# Patient Record
Sex: Female | Born: 2000 | Race: White | Hispanic: No | Marital: Single | Smoking: Never smoker
Health system: Southern US, Community
[De-identification: ages and names within clinical notes are randomized; demographics above are authoritative.]

## PROBLEM LIST (undated history)

## (undated) DIAGNOSIS — E236 Other disorders of pituitary gland: Secondary | ICD-10-CM

## (undated) DIAGNOSIS — K8020 Calculus of gallbladder without cholecystitis without obstruction: Secondary | ICD-10-CM

## (undated) DIAGNOSIS — R519 Headache, unspecified: Secondary | ICD-10-CM

## (undated) DIAGNOSIS — G935 Compression of brain: Secondary | ICD-10-CM

## (undated) DIAGNOSIS — Z8489 Family history of other specified conditions: Secondary | ICD-10-CM

## (undated) DIAGNOSIS — T7840XA Allergy, unspecified, initial encounter: Secondary | ICD-10-CM

## (undated) DIAGNOSIS — Z973 Presence of spectacles and contact lenses: Secondary | ICD-10-CM

## (undated) DIAGNOSIS — R51 Headache: Secondary | ICD-10-CM

## (undated) DIAGNOSIS — G43909 Migraine, unspecified, not intractable, without status migrainosus: Secondary | ICD-10-CM

## (undated) HISTORY — PX: ADENOIDECTOMY: SUR15

## (undated) HISTORY — PX: ADENOIDECTOMY W/ MYRINGOTOMY: SHX1128

## (undated) HISTORY — PX: TYMPANOSTOMY TUBE PLACEMENT: SHX32

## (undated) HISTORY — DX: Headache: R51

## (undated) HISTORY — DX: Headache, unspecified: R51.9

---
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
## 2022-02-10 ENCOUNTER — Emergency Department (HOSPITAL_COMMUNITY)
Admission: EM | Admit: 2022-02-10 | Discharge: 2022-02-11 | Disposition: A | Discharge status: Home / Self Care | Payer: Managed Care, Other (non HMO) | Attending: Emergency Medicine | Admitting: Emergency Medicine

## 2022-02-10 ENCOUNTER — Other Ambulatory Visit: Payer: Self-pay

## 2022-02-10 ENCOUNTER — Encounter (HOSPITAL_COMMUNITY): Payer: Self-pay

## 2022-02-10 DIAGNOSIS — R509 Fever, unspecified: Secondary | ICD-10-CM | POA: Diagnosis present

## 2022-02-10 DIAGNOSIS — B349 Viral infection, unspecified: Secondary | ICD-10-CM | POA: Diagnosis not present

## 2022-02-10 DIAGNOSIS — Z1152 Encounter for screening for COVID-19: Secondary | ICD-10-CM | POA: Diagnosis not present

## 2022-02-10 MED ORDER — ONDANSETRON 4 MG PO TBDP
4.0000 mg | ORAL_TABLET | Freq: Once | ORAL | Status: AC
  Administered 2022-02-11: 4 mg via ORAL
  Filled 2022-02-10: qty 1

## 2022-02-10 NOTE — ED Triage Notes (Signed)
Pt arrived via POV c/o fever at work yesterday. Pt reports last taking '1000mg'$  of Tylenol today @ 1700. Pt endorses N/V/D, body aches, cough and sinus congestion.

## 2022-02-10 NOTE — ED Provider Notes (Signed)
[facility] EMERGENCY DEPARTMENT  Provider Note  CSN: [id number] Arrival date & time: 02/10/22 2326  History Chief Complaint  Patient presents with   Fever    Gabrielle Andrews is a 21 y.o. female reports she began having flu-like symptoms 2 days ago with fever, cough, sore throat, N/V/D. Last vomited earlier in the day today but has not tried eating anything due to nausea all day. She has not had a fever today but still feels bad despite taking OTC cold meds.    Home Medications Prior to Admission medications   Medication Sig Start Date End Date Taking? Authorizing Provider  ondansetron (ZOFRAN-ODT) 4 MG disintegrating tablet Take 1 tablet (4 mg total) by mouth every 8 (eight) hours as needed for nausea or vomiting. 02/11/22  Yes Truddie Hidden, MD  fluticasone (FLONASE) 50 MCG/ACT nasal spray Place 1 spray into both nostrils daily. 10/25/20   Lamptey, Myrene Galas, MD  ibuprofen (ADVIL) 600 MG tablet Take 1 tablet (600 mg total) by mouth every 6 (six) hours as needed for mild pain or moderate pain. 10/20/18   Adibe, Dannielle Huh, MD  lidocaine (LIDODERM) 5 % Place 1 patch onto the skin daily. Remove & Discard patch within 12 hours or as directed by MD 12/19/19   Caccavale, Sophia, PA-C  norethindrone (MICRONOR) 0.35 MG tablet Take 1 tablet by mouth daily.    [provider]  omeprazole (PRILOSEC) 20 MG capsule Take 1 capsule (20 mg total) by mouth daily. 06/18/19 12/15/19  Lelon Huh, MD  propranolol (INDERAL) 20 MG tablet TAKE 1 TABLET BY MOUTH TWICE DAILY 04/19/19   Teressa Lower, MD     Allergies    Other and Shrimp [shellfish allergy]   Review of Systems   Review of Systems Please see HPI for pertinent positives and negatives  Physical Exam BP 107/76   Pulse 89   Temp 98 F (36.7 C) (Oral)   Resp 16   Ht '5\' 4"'$  (1.626 m)   Wt 72.6 kg   SpO2 99%   BMI 27.46 kg/m   Physical Exam Vitals and nursing note reviewed.  Constitutional:      Appearance: Normal  appearance.  HENT:     Head: Normocephalic and atraumatic.     Nose: Nose normal.     Mouth/Throat:     Mouth: Mucous membranes are dry.     Pharynx: No oropharyngeal exudate or posterior oropharyngeal erythema.  Eyes:     Extraocular Movements: Extraocular movements intact.     Conjunctiva/sclera: Conjunctivae normal.  Cardiovascular:     Rate and Rhythm: Normal rate.  Pulmonary:     Effort: Pulmonary effort is normal.     Breath sounds: Normal breath sounds.  Abdominal:     General: Abdomen is flat.     Palpations: Abdomen is soft.     Tenderness: There is no abdominal tenderness.  Musculoskeletal:        General: No swelling. Normal range of motion.     Cervical back: Neck supple.  Skin:    General: Skin is warm and dry.  Neurological:     General: No focal deficit present.     Mental Status: She is alert.  Psychiatric:        Mood and Affect: Mood normal.     ED Results / Procedures / Treatments   EKG None  Procedures Procedures  Medications Ordered in the ED Medications  ondansetron (ZOFRAN-ODT) disintegrating tablet 4 mg (4 mg Oral Given 02/11/22 0001)  Initial Impression and Plan  Patient here with symptoms consistent with a viral illness. Covid/Flu swab is pending. Will give Zofran ODT for nausea. Otherwise her vitals and exam are benign.   ED Course   Clinical Course as of 02/11/22 0037  Mon Feb 11, 2022  0035 Covid/Flu are negative. Patient has not vomited in the ED. Recommend continued symptomatic care at home. PCP follow up. RTED for any other concerns.  [CS]    Clinical Course User Index [CS] Truddie Hidden, MD     MDM Rules/Calculators/A&P Medical Decision Making Problems Addressed: Viral syndrome: acute illness or injury  Amount and/or Complexity of Data Reviewed Labs: ordered. Decision-making details documented in ED Course.  Risk Prescription drug management.    Final Clinical Impression(s) / ED Diagnoses Final diagnoses:   Viral syndrome    Rx / DC Orders ED Discharge Orders          Ordered    ondansetron (ZOFRAN-ODT) 4 MG disintegrating tablet  Every 8 hours PRN        02/11/22 0036             Truddie Hidden, MD 02/11/22 [phone]

## 2022-02-11 LAB — RESP PANEL BY RT-PCR (FLU A&B, COVID) ARPGX2
Influenza A by PCR: NEGATIVE
Influenza B by PCR: NEGATIVE
SARS Coronavirus 2 by RT PCR: NEGATIVE

## 2022-02-11 MED ORDER — ONDANSETRON 4 MG PO TBDP: 4.0000 mg | ORAL_TABLET | Freq: Three times a day (TID) | ORAL | 0 refills | Status: DC | PRN

## 2022-02-27 ENCOUNTER — Other Ambulatory Visit: Payer: Self-pay

## 2022-02-27 ENCOUNTER — Ambulatory Visit
Admission: EM | Admit: 2022-02-27 | Discharge: 2022-02-27 | Disposition: A | Discharge status: Home / Self Care | Payer: Managed Care, Other (non HMO) | Attending: Nurse Practitioner | Admitting: Nurse Practitioner

## 2022-02-27 ENCOUNTER — Encounter: Payer: Self-pay | Admitting: Emergency Medicine

## 2022-02-27 DIAGNOSIS — R102 Pelvic and perineal pain: Secondary | ICD-10-CM

## 2022-02-27 DIAGNOSIS — N939 Abnormal uterine and vaginal bleeding, unspecified: Secondary | ICD-10-CM

## 2022-02-27 LAB — POCT URINALYSIS DIP (MANUAL ENTRY)
Bilirubin, UA: NEGATIVE
Blood, UA: NEGATIVE
Glucose, UA: NEGATIVE mg/dL
Ketones, POC UA: NEGATIVE mg/dL
Leukocytes, UA: NEGATIVE
Nitrite, UA: NEGATIVE
Protein Ur, POC: 100 mg/dL — AB
Spec Grav, UA: 1.02 (ref 1.010–1.025)
Urobilinogen, UA: 0.2 E.U./dL
pH, UA: >=9 — AB (ref 5.0–8.0)

## 2022-02-27 LAB — POCT URINE PREGNANCY: Preg Test, Ur: NEGATIVE

## 2022-02-27 NOTE — Discharge Instructions (Addendum)
Cytology swab is pending.  You will be contacted if the results of the test are positive to discuss treatment.  If you have access to MyChart, you will also be able to see the results there. Recommend over-the-counter ibuprofen 600 to 800 mg or (3 to 4 tablets) every 8 hours. Warm compresses or heating pad to the lower pelvic region to help with abdominal pain or cramping. As discussed, if your symptoms begin to worsen such as worsening abdominal pain, heavy bleeding to include saturating a pad or tampon every hour, or other concerns, please go to the emergency department for further evaluation. Follow-up as needed.

## 2022-02-27 NOTE — ED Triage Notes (Signed)
Pt reports constant lower abdominal/bladder pain,increased urinary frequency since this am. Pt reports bleeding noted to tissue with wiping. Reports is on birth control but reports is not currently taking pills. LMP 11/4.

## 2022-02-27 NOTE — ED Provider Notes (Signed)
[facility]    CSN: [id number] Arrival date & time: 02/27/22  1504      History   Chief Complaint Chief Complaint  Patient presents with   Hematuria    HPI Gabrielle Andrews is a 21 y.o. female.   The history is provided by the patient.   Patient presents for complaints of lower abdominal pain and bleeding with wiping that started today.  Patient states her symptoms woke her up from her sleep this morning.  She describes her abdominal pain as "cramping".  She states that she has been off of her birth control over the last 2 weeks.  She states her last menstrual cycle was on 02/02/2022.  Patient states that she noticed bleeding when she wiped with urinating.  She states that she has not had any other bleeding outside of this.  She denies saturation of pads or tampons at this time.  She states that she is sexually active, but does not feel concerned about STIs or STDs.  She states that she did take Tylenol on 1 occasion for her pelvic pain.  She she also complains of urinary frequency since this morning.  She denies fever, chills, urgency, hesitancy, vaginal discharge, vaginal odor, or vaginal itching.  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      Home Medications    Prior to Admission medications   Medication Sig Start Date End Date Taking? Authorizing Provider  fluticasone (FLONASE) 50 MCG/ACT nasal spray Place 1 spray into both nostrils daily. 10/25/20   Lamptey, Myrene Galas, MD  ibuprofen (ADVIL) 600 MG tablet Take 1 tablet (600 mg total) by mouth every 6 (six) hours as needed for mild pain or moderate pain. 10/20/18   Adibe, Dannielle Huh, MD  lidocaine (LIDODERM) 5 % Place 1 patch onto the skin daily. Remove & Discard patch within 12 hours or as directed by MD 12/19/19   Caccavale, Sophia, PA-C  norethindrone (MICRONOR) 0.35 MG tablet Take 1 tablet by mouth daily.    [provider]  omeprazole (PRILOSEC) 20 MG capsule Take 1 capsule (20 mg total) by mouth daily. 06/18/19 12/15/19  Lelon Huh, MD  ondansetron (ZOFRAN-ODT) 4 MG disintegrating tablet Take 1 tablet (4 mg total) by mouth every 8 (eight) hours as needed for nausea or vomiting. 02/11/22   Truddie Hidden, MD  propranolol (INDERAL) 20 MG tablet TAKE 1 TABLET BY MOUTH TWICE DAILY 04/19/19   Teressa Lower, MD    Family History Family History  Problem Relation Age of Onset   Healthy Father    Migraines Maternal Aunt    Seizures Paternal Uncle    Anemia Maternal Grandmother    Hypertension Maternal Grandfather    Chiari malformation Maternal Grandfather    Throat cancer Paternal Grandmother    Heart disease Paternal Grandfather    Thyroid disease Maternal Great-grandmother    Thyroid disease Maternal Great-grandmother    Autism Neg Hx    ADD / ADHD Neg Hx    Depression Neg Hx    Bipolar disorder Neg Hx    Schizophrenia Neg Hx     Social History Social History   Tobacco Use   Smoking status: Never   Smokeless tobacco: Never  Vaping Use   Vaping Use: Never used  Substance Use  Topics   Alcohol use: No    Alcohol/week: 0.0 standard drinks of alcohol   Drug use: No     Allergies   Other and Shrimp [shellfish allergy]   Review of Systems Review of Systems Per HPI  Physical Exam Triage Vital Signs ED Triage Vitals [02/27/22 1512]  Enc Vitals Group     BP 115/82     Pulse Rate 82     Resp 20     Temp 98.5 F (36.9 C)     Temp Source Oral     SpO2 97 %     Weight      Height      Head Circumference      Peak Flow      Pain Score 10     Pain Loc      Pain Edu?      Excl. in [place]?    No data found.  Updated Vital Signs BP 115/82 (BP Location: Right Arm)   Pulse 82   Temp 98.5 F (36.9 C) (Oral)   Resp 20   LMP 02/02/2022 (Approximate)   SpO2 97%   Visual Acuity Right Eye Distance:   Left Eye Distance:   Bilateral Distance:    Right Eye Near:   Left Eye Near:    Bilateral Near:     Physical Exam Vitals and nursing note reviewed.  Constitutional:      General: She is not in acute distress.    Appearance: Normal appearance.  HENT:     Head: Normocephalic.  Eyes:     Extraocular Movements: Extraocular movements intact.     Conjunctiva/sclera: Conjunctivae normal.     Pupils: Pupils are equal, round, and reactive to light.  Cardiovascular:     Rate and Rhythm: Normal rate and regular rhythm.     Pulses: Normal pulses.     Heart sounds: Normal heart sounds.  Pulmonary:     Effort: Pulmonary effort is normal.     Breath sounds: Normal breath sounds.  Abdominal:     General: Bowel sounds are normal.     Palpations: Abdomen is soft.  Genitourinary:    Comments: GU exam deferred, self swab performed  Musculoskeletal:     Cervical back: Normal range of motion.  Skin:    General: Skin is warm and dry.  Neurological:     General: No focal deficit present.     Mental Status: She is alert and oriented to person, place, and time.  Psychiatric:        Mood and Affect: Mood normal.        Behavior: Behavior normal.      UC  Treatments / Results  Labs (all  labs ordered are listed, but only abnormal results are displayed) Labs Reviewed  POCT URINALYSIS DIP (MANUAL ENTRY) - Abnormal; Notable for the following components:      Result Value   Clarity, UA hazy (*)    pH, UA >=9.0 (*)    Protein Ur, POC =100 (*)    All other components within normal limits  POCT URINE PREGNANCY  CERVICOVAGINAL ANCILLARY ONLY    EKG   Radiology No results found.  Procedures Procedures (including critical care time)  Medications Ordered in UC Medications - No data to display  Initial Impression / Assessment and Plan / UC Course  I have reviewed the triage vital signs and the nursing notes.  Pertinent labs & imaging results that were available during my care of the patient were reviewed by me and considered in my medical decision making (see chart for details).  Patient presents for complaints of pelvic cramping and vaginal bleeding.  She states that she noticed the bleeding this morning when she was wiping.  She states since that time she has also had pelvic cramping.  Patient reports that she has been off of her OCP for approximately 2 weeks.  Urinalysis is negative for hematuria, urine pregnancy test is negative.  Discussion with patient regarding likelihood of symptoms.  Would like for her to restart her birth control as this most likely is causing the abnormal bleeding.  Cytology testing is also pending at this time.  Would like for patient to try use of ibuprofen 600 to 800 mg to see if this helps with her symptoms.  Advised patient that if symptoms do not improve, and cytology testing is negative, recommend that she follow-up for further evaluation to include a pelvic exam.  Patient was given strict indications of when to go to the emergency department.  Patient verbalizes understanding.  All questions were answered.  Patient is stable for discharge. Final Clinical Impressions(s) / UC Diagnoses   Final diagnoses:  Pelvic  cramping  Vaginal bleeding     Discharge Instructions      Cytology swab is pending.  You will be contacted if the results of the test are positive to discuss treatment.  If you have access to MyChart, you will also be able to see the results there. Recommend over-the-counter ibuprofen 600 to 800 mg or (3 to 4 tablets) every 8 hours. Warm compresses or heating pad to the lower pelvic region to help with abdominal pain or cramping. As discussed, if your symptoms begin to worsen such as worsening abdominal pain, heavy bleeding to include saturating a pad or tampon every hour, or other concerns, please go to the emergency department for further evaluation. Follow-up as needed.     ED Prescriptions   None    PDMP not reviewed this encounter.   Tish Men, NP 02/27/22 1724

## 2022-02-28 ENCOUNTER — Telehealth (HOSPITAL_COMMUNITY): Payer: Self-pay | Admitting: Emergency Medicine

## 2022-02-28 LAB — CERVICOVAGINAL ANCILLARY ONLY
Bacterial Vaginitis (gardnerella): POSITIVE — AB
Candida Glabrata: NEGATIVE
Candida Vaginitis: NEGATIVE
Chlamydia: NEGATIVE
Comment: NEGATIVE
Comment: NEGATIVE
Comment: NEGATIVE
Comment: NEGATIVE
Comment: NEGATIVE
Comment: NORMAL
Neisseria Gonorrhea: NEGATIVE
Trichomonas: NEGATIVE

## 2022-02-28 MED ORDER — METRONIDAZOLE 500 MG PO TABS: 500.0000 mg | ORAL_TABLET | Freq: Two times a day (BID) | ORAL | 0 refills | Status: DC

## 2022-07-12 ENCOUNTER — Ambulatory Visit: Payer: Managed Care, Other (non HMO) | Admitting: Physician Assistant

## 2022-07-12 ENCOUNTER — Other Ambulatory Visit (INDEPENDENT_AMBULATORY_CARE_PROVIDER_SITE_OTHER): Payer: BC Managed Care – PPO

## 2022-07-12 ENCOUNTER — Encounter: Payer: Self-pay | Admitting: Physician Assistant

## 2022-07-12 ENCOUNTER — Ambulatory Visit: Payer: BC Managed Care – PPO | Admitting: Physician Assistant

## 2022-07-12 DIAGNOSIS — M25562 Pain in left knee: Secondary | ICD-10-CM

## 2022-07-12 NOTE — Progress Notes (Signed)
Office Visit Note   Patient: Gabrielle Andrews           Date of Birth: [birth year]           MRN: [id number] Visit Date: 07/12/2022              Requested by: No referring provider defined for this encounter. PCP: Patient, No Pcp Per  Chief Complaint  Patient presents with   Left Knee - Pain      HPI: Patient is a pleasant 22 year old woman who is accompanied by her mother.  She is 2 days status post falling down directly on her left knee.  Denies any instability.  Denies any previous injury.  It is painful when she extends and flexes it.  Does not think she had a subluxation and does not have any history of this.  Assessment & Plan: Visit Diagnoses:  1. Acute pain of left knee   2. Left anterior knee pain     Plan: Findings consistent with a left knee contusion.  She is not tender over the medial lateral side of her knee she has good varus valgus stability and is focally tender around the kneecap with an associated contusion.  She has good strength with extension of her leg though it is painful.  Quadricep and patella tendon are functioning.  Compartments are soft and nontender.  We did give her a supportive brace.  Will use Voltaren gel and anti-inflammatories did encourage her to extend and bend her knee.  Do not see any sign of skin breakdown or bursa infection  Follow-Up Instructions: No follow-ups on file.   Ortho Exam  Patient is alert, oriented, no adenopathy, well-dressed, normal affect, normal respiratory effort. Examination of her left knee she has a contusion focally over the kneecap.  She has good pulses distally compartments are soft and nontender she can extend and flex her ankle without difficulty.  She has good endpoint on anterior draw.  She is just focally tender around the kneecap no real apprehension sign.  No effusion.  Quadricep and patella tendons are intact  Imaging: XR KNEE 3 VIEW LEFT  Result Date: 07/12/2022 Radiographs of her left knee were reviewed  today well-preserved joint spaces no evidence of any acute fracture small calcification in the popliteal fossa does not appear new  No images are attached to the encounter.  Labs: Lab Results  Component Value Date   REPTSTATUS 08/09/2021 FINAL 08/07/2021   CULT MULTIPLE SPECIES PRESENT, SUGGEST RECOLLECTION (A) 08/07/2021     Lab Results  Component Value Date   ALBUMIN 4.2 10/22/2021   ALBUMIN 3.7 10/17/2018    No results found for: "MG" No results found for: "VD25OH"  No results found for: "PREALBUMIN"    Latest Ref Rng & Units 10/22/2021    4:59 PM 11/11/2018    9:19 AM 10/17/2018    8:40 PM  CBC EXTENDED  WBC 3.4 - 10.8 x10E3/uL 6.6   6.1   RBC 3.77 - 5.28 x10E6/uL 4.24   4.70   Hemoglobin 11.1 - 15.9 g/dL 40.9  81.1  91.4   HCT 34.0 - 46.6 % 38.6   42.0   Platelets 150 - 450 x10E3/uL 299   372   NEUT# 1.4 - 7.0 x10E3/uL 4.5   3.6   Lymph# 0.7 - 3.1 x10E3/uL 1.5   1.8      There is no height or weight on file to calculate BMI.  Orders:  Orders Placed This Encounter  Procedures  XR KNEE 3 VIEW LEFT   No orders of the defined types were placed in this encounter.    Procedures: No procedures performed  Clinical Data: No additional findings.  ROS:  All other systems negative, except as noted in the HPI. Review of Systems  Objective: Vital Signs: There were no vitals taken for this visit.  Specialty Comments:  No specialty comments available.  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/2018   Tremor of both hands 04/21/2017   Frequent headaches 04/21/2017   Past Medical History:  Diagnosis Date   Allergy    Chiari malformation type I (HCC)    Enlarged pituitary gland (HCC)    Family history of adverse reaction to anesthesia    Pt MGM  was hypotensive    Headache    Migraine     Symptomatic cholelithiasis    Wears glasses     Family History  Problem Relation Age of Onset   Healthy Father    Migraines Maternal Aunt    Seizures Paternal Uncle    Anemia Maternal Grandmother    Hypertension Maternal Grandfather    Chiari malformation Maternal Grandfather    Throat cancer Paternal Grandmother    Heart disease Paternal Grandfather    Thyroid disease Maternal Great-grandmother    Thyroid disease Maternal Great-grandmother    Autism Neg Hx    ADD / ADHD Neg Hx    Depression Neg Hx    Bipolar disorder Neg Hx    Schizophrenia Neg Hx     Past Surgical History:  Procedure Laterality Date   ADENOIDECTOMY     ADENOIDECTOMY W/ MYRINGOTOMY     LAPAROSCOPIC CHOLECYSTECTOMY PEDIATRIC N/A 11/11/2018   Procedure: LAPAROSCOPIC CHOLECYSTECTOMY PEDIATRIC;  Surgeon: Kandice Hams, MD;  Location: [facility] OR;  Service: Pediatrics;  Laterality: N/A;   TYMPANOSTOMY TUBE PLACEMENT     Social History   Occupational History   Not on file  Tobacco Use   Smoking status: Never   Smokeless tobacco: Never  Vaping Use   Vaping Use: Never used  Substance and Sexual Activity   Alcohol use: No    Alcohol/week: 0.0 standard drinks of alcohol   Drug use: No   Sexual activity: Never

## 2022-08-30 DIAGNOSIS — Z3041 Encounter for surveillance of contraceptive pills: Secondary | ICD-10-CM | POA: Diagnosis not present

## 2022-08-30 DIAGNOSIS — F17210 Nicotine dependence, cigarettes, uncomplicated: Secondary | ICD-10-CM | POA: Diagnosis not present

## 2022-08-30 DIAGNOSIS — Z6830 Body mass index (BMI) 30.0-30.9, adult: Secondary | ICD-10-CM | POA: Diagnosis not present

## 2022-08-30 DIAGNOSIS — Z299 Encounter for prophylactic measures, unspecified: Secondary | ICD-10-CM | POA: Diagnosis not present

## 2023-02-10 DIAGNOSIS — Z23 Encounter for immunization: Secondary | ICD-10-CM | POA: Diagnosis not present

## 2023-02-10 DIAGNOSIS — Z1331 Encounter for screening for depression: Secondary | ICD-10-CM | POA: Diagnosis not present

## 2023-02-10 DIAGNOSIS — F17210 Nicotine dependence, cigarettes, uncomplicated: Secondary | ICD-10-CM | POA: Diagnosis not present

## 2023-02-10 DIAGNOSIS — Z3041 Encounter for surveillance of contraceptive pills: Secondary | ICD-10-CM | POA: Diagnosis not present

## 2023-02-10 DIAGNOSIS — F419 Anxiety disorder, unspecified: Secondary | ICD-10-CM | POA: Diagnosis not present

## 2023-02-10 DIAGNOSIS — Z299 Encounter for prophylactic measures, unspecified: Secondary | ICD-10-CM | POA: Diagnosis not present

## 2023-02-10 DIAGNOSIS — Z0000 Encounter for general adult medical examination without abnormal findings: Secondary | ICD-10-CM | POA: Diagnosis not present

## 2023-03-04 DIAGNOSIS — K5900 Constipation, unspecified: Secondary | ICD-10-CM | POA: Diagnosis not present

## 2023-03-04 DIAGNOSIS — R109 Unspecified abdominal pain: Secondary | ICD-10-CM | POA: Diagnosis not present

## 2023-03-04 DIAGNOSIS — R197 Diarrhea, unspecified: Secondary | ICD-10-CM | POA: Diagnosis not present

## 2023-03-04 DIAGNOSIS — G8929 Other chronic pain: Secondary | ICD-10-CM | POA: Diagnosis not present

## 2023-04-18 ENCOUNTER — Encounter: Payer: Self-pay | Admitting: Gastroenterology

## 2023-04-18 ENCOUNTER — Ambulatory Visit (INDEPENDENT_AMBULATORY_CARE_PROVIDER_SITE_OTHER): Payer: BC Managed Care – PPO | Admitting: Gastroenterology

## 2023-04-18 VITALS — BP 102/69 | HR 70 | Temp 98.8°F | Ht 65.0 in | Wt 163.2 lb

## 2023-04-18 DIAGNOSIS — K529 Noninfective gastroenteritis and colitis, unspecified: Secondary | ICD-10-CM | POA: Insufficient documentation

## 2023-04-18 MED ORDER — DICYCLOMINE HCL 10 MG PO CAPS: 10.0000 mg | ORAL_CAPSULE | Freq: Three times a day (TID) | ORAL | 1 refills | Status: DC

## 2023-04-18 NOTE — Progress Notes (Unsigned)
GI Office Note    Referring Provider: Ignatius Specking, MD Primary Care Physician:  Ignatius Specking, MD  Primary Gastroenterologist: Hennie Duos. Marletta Lor, DO   Chief Complaint   Chief Complaint  Patient presents with   Abdominal Pain    States that she has always had troubles with her stomach and has to go to the bathroom all the time     History of Present Illness   Gabrielle Andrews is a 23 y.o. female presenting today at the request of Dr. Sherril Croon for abdominal pain, diarrhea.   Longstanding history of stomach issues. Predating her cholecystectomy and treatment for enlarged pituitary tumor. In 2020, she was on high dose prednisone 90mg  daily for six months followed by hydrocortisone for another six months. During this time her stomach issues seem to get worse. She required use of omeprazole for about a year due to the steroids. [facility] [place] and tried numerous natural products like licorice, GI revive, Loss adjuster, chartered. 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 Diagnosed with vitamin D and B12 deficiency and was supplemented for some time.   Continue pre/probiotics.   Prednisone 90mg  daily. 2020.  Then to hydrocortisone. Total of about 10 months.   Omeprazole for one year.   [facility] [place],   Some stomach issues before but not as bad.  Before maybe nervous stomach.   Constantly having to go to restoom.  Stomach pain.  Some vomiting Rare skip per day. Some nocturnal stools 3 stools per day. Eat six and bed 8 More tendenency. /loose/urgency. No melena, brbpr.  Abd pain better after BM   Pre/probiotic Before gb. Gallstones  TUMS helps some  Occ ibuprfoen.  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     ibuprofen (ADVIL) 600 MG tablet Take 1 tablet (600 mg total) by mouth every 6 (six) hours as needed for mild pain or moderate pain. 30 tablet 0   norethindrone (MICRONOR) 0.35 MG tablet Take 1 tablet by mouth daily.     No current facility-administered medications for this visit.    Allergies   Allergies as of 04/18/2023 - Review Complete 04/18/2023  Allergen Reaction Noted   Other  12/19/2019   Shrimp [shellfish allergy]  12/19/2019    Past Medical History   Past Medical History:  Diagnosis Date   Allergy    Chiari malformation type I (HCC)    Enlarged pituitary gland (HCC)    Family history of adverse reaction to anesthesia    Pt MGM  was hypotensive    Headache    Migraine    Symptomatic cholelithiasis    Wears glasses     Past Surgical History   Past Surgical History:  Procedure Laterality Date   ADENOIDECTOMY     ADENOIDECTOMY W/ MYRINGOTOMY     LAPAROSCOPIC CHOLECYSTECTOMY PEDIATRIC N/A 11/11/2018   Procedure: LAPAROSCOPIC CHOLECYSTECTOMY PEDIATRIC;  Surgeon: Kandice Hams,  MD;  Location: [facility] OR;  Service: Pediatrics;  Laterality: N/A;   TYMPANOSTOMY TUBE PLACEMENT      Past Family History   Family History  Problem Relation Age of Onset   Healthy Father    Migraines Maternal Aunt    Seizures Paternal Uncle    Anemia Maternal Grandmother    Hypertension Maternal Grandfather    Chiari malformation Maternal Grandfather    Throat cancer Paternal Grandmother    Heart disease Paternal Grandfather    Thyroid disease Maternal Great-grandmother    Thyroid disease Maternal Great-grandmother    Autism Neg Hx    ADD / ADHD Neg Hx    Depression Neg Hx    Bipolar disorder Neg Hx    Schizophrenia Neg Hx     Past  Social History   Social History   Socioeconomic History   Marital status: Single    Spouse name: Not on file   Number of children: Not on file   Years of education: Not on file   Highest education level: Not on file  Occupational History   Not on file  Tobacco Use   Smoking status: Never   Smokeless tobacco: Never  Vaping Use   Vaping status: Never Used  Substance and Sexual Activity   Alcohol use: No    Alcohol/week: 0.0 standard drinks of alcohol   Drug use: No   Sexual activity: Never  Other Topics Concern   Not on file  Social History Narrative   Lives at home with mom and brother.    Also stays at dads with brother and stepmom.    Gabrielle Andrews is in the 11th grade at [employer].    She does well in school.    She enjoys swimming, playing volleyball and basketball.    Social Drivers of Corporate investment banker Strain: Not on file  Food Insecurity: Not on file  Transportation Needs: Not on file  Physical Activity: Not on file  Stress: Not on file  Social Connections: Not on file  Intimate Partner Violence: Not on file    Review of Systems   General: Negative for anorexia, weight loss, fever, chills, fatigue, weakness. Eyes: Negative for vision changes.  ENT: Negative for hoarseness, difficulty swallowing , nasal congestion. CV: Negative for chest pain, angina, palpitations, dyspnea on exertion, peripheral edema.  Respiratory: Negative for dyspnea at rest, dyspnea on exertion, cough, sputum, wheezing.  GI: See history of present illness. GU:  Negative for dysuria, hematuria, urinary incontinence, urinary frequency, nocturnal urination.  MS: Negative for joint pain, low back pain.  Derm: Negative for rash or itching.  Neuro: Negative for weakness, abnormal sensation, seizure, frequent headaches, memory loss,  confusion.  Psych: Negative for anxiety, depression, suicidal ideation, hallucinations.  Endo: Negative for unusual weight change.  Heme: Negative  for bruising or bleeding. Allergy: Negative for rash or hives.  Physical Exam   BP 102/69 (BP Location: Right Arm, Patient Position: Sitting, Cuff Size: Normal)   Pulse 70   Temp 98.8 F (37.1 C) (Oral)   Ht 5\' 5"  (1.651 m)   Wt 163 lb 3.2 oz (74 kg)   LMP  (LMP Unknown)   SpO2 96%   BMI 27.16 kg/m    General: Well-nourished, well-developed in no acute distress.  Head: Normocephalic, atraumatic.   Eyes: Conjunctiva pink, no icterus. Mouth: Oropharyngeal mucosa moist and pink , no lesions erythema or exudate. Neck: Supple without thyromegaly, masses, or lymphadenopathy.  Lungs: Clear to auscultation bilaterally.  Heart: Regular rate and rhythm, no murmurs rubs or gallops.  Abdomen: Bowel sounds are normal, nontender, nondistended, no hepatosplenomegaly or masses,  no abdominal bruits or hernia, no rebound or guarding.   Rectal: *** Extremities: No lower extremity edema. No clubbing or deformities.  Neuro: Alert and oriented x 4 , grossly normal neurologically.  Skin: Warm and dry, no rash or jaundice.   Psych: Alert and cooperative, normal mood and affect.  Labs   *** Imaging Studies   No results found.  Assessment       PLAN   ***   Leanna Battles. Melvyn Neth, MHS, PA-C [facility]

## 2023-04-18 NOTE — Patient Instructions (Signed)
Complete labs at [facility]. We will be in touch with results as available.  Trial of dicyclomine up to four times daily as needed for abdominal cramping and loose stool.  I suspect you have irritable bowel syndrome. We will be ruling out celiac disease, screening for Crohn's and ulcerative colitis. Please read IBS information provided today.

## 2023-04-21 ENCOUNTER — Encounter: Payer: Self-pay | Admitting: Gastroenterology

## 2023-04-24 DIAGNOSIS — K529 Noninfective gastroenteritis and colitis, unspecified: Secondary | ICD-10-CM | POA: Diagnosis not present

## 2023-04-27 LAB — IGA: IgA/Immunoglobulin A, Serum: 165 mg/dL (ref 87–352)

## 2023-04-27 LAB — SEDIMENTATION RATE: Sed Rate: 2 mm/h (ref 0–32)

## 2023-04-27 LAB — TISSUE TRANSGLUTAMINASE, IGA

## 2023-04-27 LAB — C-REACTIVE PROTEIN: CRP: <1 mg/L (ref 0–10)

## 2023-05-26 ENCOUNTER — Telehealth: Payer: Self-pay

## 2023-05-26 NOTE — Telephone Encounter (Signed)
 That's great! Let's have her follow up in six months.

## 2023-05-26 NOTE — Telephone Encounter (Signed)
 Pt called stating that the dicyclomine is helping a lot. Was told to call with an update.

## 2023-06-25 ENCOUNTER — Other Ambulatory Visit: Payer: Self-pay | Admitting: Gastroenterology

## 2023-09-01 ENCOUNTER — Other Ambulatory Visit: Payer: Self-pay | Admitting: Gastroenterology

## 2023-10-30 ENCOUNTER — Encounter: Payer: Self-pay | Admitting: Gastroenterology

## 2024-04-08 ENCOUNTER — Encounter: Admitting: Obstetrics & Gynecology

## 2024-04-27 ENCOUNTER — Other Ambulatory Visit: Payer: Self-pay | Admitting: Gastroenterology

## 2024-05-17 ENCOUNTER — Encounter: Admitting: Obstetrics & Gynecology

## 2024-06-14 ENCOUNTER — Encounter: Admitting: Obstetrics & Gynecology
